# Patient Record
Sex: Male | Born: 1990 | Race: White | Hispanic: No | Marital: Single | Smoking: Never smoker
Health system: Southern US, Community
[De-identification: ages and names within clinical notes are randomized; demographics above are authoritative.]

---

## 2015-04-25 ENCOUNTER — Emergency Department: Payer: Commercial Indemnity

## 2015-04-25 ENCOUNTER — Emergency Department
Admission: EM | Admit: 2015-04-25 | Discharge: 2015-04-26 | Disposition: A | Discharge status: Home / Self Care | Payer: Commercial Indemnity | Attending: Emergency Medicine | Admitting: Emergency Medicine

## 2015-04-25 ENCOUNTER — Encounter: Payer: Self-pay | Admitting: Emergency Medicine

## 2015-04-25 DIAGNOSIS — S67194A Crushing injury of right ring finger, initial encounter: Secondary | ICD-10-CM | POA: Insufficient documentation

## 2015-04-25 DIAGNOSIS — S62632A Displaced fracture of distal phalanx of right middle finger, initial encounter for closed fracture: Secondary | ICD-10-CM | POA: Insufficient documentation

## 2015-04-25 DIAGNOSIS — Y9289 Other specified places as the place of occurrence of the external cause: Secondary | ICD-10-CM | POA: Diagnosis not present

## 2015-04-25 DIAGNOSIS — Y998 Other external cause status: Secondary | ICD-10-CM | POA: Insufficient documentation

## 2015-04-25 DIAGNOSIS — S60041A Contusion of right ring finger without damage to nail, initial encounter: Secondary | ICD-10-CM | POA: Diagnosis not present

## 2015-04-25 DIAGNOSIS — W231XXA Caught, crushed, jammed, or pinched between stationary objects, initial encounter: Secondary | ICD-10-CM | POA: Insufficient documentation

## 2015-04-25 DIAGNOSIS — S62634A Displaced fracture of distal phalanx of right ring finger, initial encounter for closed fracture: Secondary | ICD-10-CM | POA: Diagnosis not present

## 2015-04-25 DIAGNOSIS — S60031A Contusion of right middle finger without damage to nail, initial encounter: Secondary | ICD-10-CM | POA: Insufficient documentation

## 2015-04-25 DIAGNOSIS — S67192A Crushing injury of right middle finger, initial encounter: Secondary | ICD-10-CM | POA: Insufficient documentation

## 2015-04-25 DIAGNOSIS — S6720XA Crushing injury of unspecified hand, initial encounter: Secondary | ICD-10-CM

## 2015-04-25 DIAGNOSIS — Y9389 Activity, other specified: Secondary | ICD-10-CM | POA: Diagnosis not present

## 2015-04-25 MED ORDER — IBUPROFEN 800 MG PO TABS
ORAL_TABLET | ORAL | Status: AC
  Administered 2015-04-25: 800 mg via ORAL
  Filled 2015-04-25: qty 1

## 2015-04-25 MED ORDER — IBUPROFEN 800 MG PO TABS
800.0000 mg | ORAL_TABLET | Freq: Once | ORAL | Status: AC
  Administered 2015-04-25: 800 mg via ORAL

## 2015-04-25 MED ORDER — IBUPROFEN 800 MG PO TABS: 800.0000 mg | ORAL_TABLET | Freq: Once | ORAL | Status: DC

## 2015-04-25 NOTE — ED Notes (Signed)
Was helping friends lay a counter top and his fingers got crushed under - he pulled them out and has a partially avulsed fingernail and both are bruised.

## 2015-04-25 NOTE — ED Provider Notes (Signed)
[facility] Emergency Department Provider Note  ____________________________________________  Time seen: Approximately 11:46 PM  I have reviewed the triage vital signs and the nursing notes.   HISTORY  Chief Complaint Finger Injury    HPI Joseph Holmes is a 24 y.o. male who presents to the ED from home s/p finger injury. Patient was helping friends lay a countertop and his right third and fourth fingertips got crushed underneath. Patient complains of 5/10 throbbing type pain to injured fingers. Tetanus shot is up-to-date. Patient is right-hand dominant. Denies other injuries and voices no other complaints. Specifically denies numbness/tingling.   History reviewed. No pertinent past medical history.  Patient denies history of diabetes  There are no active problems to display for this patient.   History reviewed. No pertinent past surgical history.  No current outpatient prescriptions on file.  Allergies Review of patient's allergies indicates no known allergies.  History reviewed. No pertinent family history.  Social History History  Substance Use Topics  . Smoking status: Never Smoker   . Smokeless tobacco: Not on file  . Alcohol Use: 0.6 - 1.2 oz/week    1-2 Cans of beer per week     Comment: beer daily    Review of Systems Constitutional: No fever/chills Eyes: No visual changes. ENT: No sore throat. Cardiovascular: Denies chest pain. Respiratory: Denies shortness of breath. Gastrointestinal: No abdominal pain.  No nausea, no vomiting.  No diarrhea.  No constipation. Genitourinary: Negative for dysuria. Musculoskeletal: Positive for injury and pain to right third and fourth fingers. Negative for back pain. Skin: Negative for rash. Neurological: Negative for headaches, focal weakness or numbness.  10-point ROS otherwise negative.  ____________________________________________   PHYSICAL EXAM:  VITAL SIGNS: ED Triage Vitals  Enc  Vitals Group     BP 04/25/15 2134 141/87 mmHg     Pulse Rate 04/25/15 2134 95     Resp 04/25/15 2345 18     Temp 04/25/15 2134 97.9 F (36.6 C)     Temp Source 04/25/15 2134 Oral     SpO2 04/25/15 2134 97 %     Weight --      Height --      Head Cir --      Peak Flow --      Pain Score 04/25/15 2117 3     Pain Loc --      Pain Edu? --      Excl. in GC? --     Constitutional: Alert and oriented. Well appearing and in no acute distress. Eyes: Conjunctivae are normal. PERRL. EOMI. Head: Atraumatic. Nose: No congestion/rhinnorhea. Mouth/Throat: Mucous membranes are moist.  Oropharynx non-erythematous. Neck: No stridor.   Cardiovascular: Normal rate, regular rhythm. Grossly normal heart sounds.  Good peripheral circulation. Respiratory: Normal respiratory effort.  No retractions. Lungs CTAB. Gastrointestinal: Soft and nontender. No distention. No abdominal bruits. No CVA tenderness. Musculoskeletal: Right hand has 2+ radial pulses. Grip strength is excellent and range of motion is intact. Right third fingertip: Avulsion injury with macerated tissue medial and inferior to nail without active bleeding. There is no nailbed laceration. There is a subungual hematoma. Patient has brisk capillary refill and digital sensation is intact. Right fourth fingertip: Small subungual hematoma. There is no avulsion or laceration. Patient has brisk capillary refill and digital sensation is intact. Neurologic:  Normal speech and language. No gross focal neurologic deficits are appreciated. Speech is normal. No gait instability. Skin:  Skin is warm, dry and intact. No rash noted. Psychiatric:  Mood and affect are normal. Speech and behavior are normal.  ____________________________________________   LABS (all labs ordered are listed, but only abnormal results are displayed)  Labs Reviewed - No data to  display ____________________________________________  EKG  None ____________________________________________  RADIOLOGY  Right hand complete (viewed by me, interpreted by Dr. Andria MeuseStevens): Comminuted fractures of the distal phalangeal tufts of the right third and fourth fingers. ____________________________________________   PROCEDURES  Procedure(s) performed: None  Critical Care performed: No  ____________________________________________   INITIAL IMPRESSION / ASSESSMENT AND PLAN / ED COURSE  Pertinent labs & imaging results that were available during my care of the patient were reviewed by me and considered in my medical decision making (see chart for details).  24 year old male s/p crush injuries to right third and fourth fingers with tufts fractures. No sutures required. Third finger was soaked in Betadine/normal saline solution and dressed in Xeroform and gauze. Extensive discussion with patient and his mother. We will treat crush injury with wound care. Did discuss that patient will most likely lose the nails on his third and fourth fingertips. Strict return precautions given. Both verbalize understanding and agree with plan of care. ____________________________________________   FINAL CLINICAL IMPRESSION(S) / ED DIAGNOSES  Final diagnoses:  Crushing injury of hand and fingers, initial encounter      Irean HongJade J Kjuan Seipp, MD 04/26/15 [phone]

## 2015-04-26 MED ORDER — IBUPROFEN 800 MG PO TABS: 800.0000 mg | ORAL_TABLET | Freq: Three times a day (TID) | ORAL | Status: AC | PRN

## 2015-04-26 MED ORDER — HYDROCODONE-ACETAMINOPHEN 5-325 MG PO TABS: 1.0000 | ORAL_TABLET | Freq: Four times a day (QID) | ORAL | Status: AC | PRN

## 2015-04-26 NOTE — Discharge Instructions (Signed)
1. Take pain medicines as needed (Motrin/Norco #15). 2. You may remove bandage in 2 days. After that please keep the wound clean and dry. 3. Return to the ER for worsening symptoms, increased redness or swelling, purulent drainage or other concerns.  Crush Injury, Fingers or Toes A crush injury to the fingers or toes means the tissues have been damaged by being squeezed (compressed). There will be bleeding into the tissues and swelling. Often, blood will collect under the skin. When this happens, the skin on the finger often dies and may slough off (shed) 1 week to 10 days later. Usually, new skin is growing underneath. If the injury has been too severe and the tissue does not survive, the damaged tissue may begin to turn black over several days.  Wounds which occur because of the crushing may be stitched (sutured) shut. However, crush injuries are more likely to become infected than other injuries.These wounds may not be closed as tightly as other types of cuts to prevent infection. Nails involved are often lost. These usually grow back over several weeks.  DIAGNOSIS X-rays may be taken to see if there is any injury to the bones. TREATMENT Broken bones (fractures) may be treated with splinting, depending on the fracture. Often, no treatment is required for fractures of the last bone in the fingers or toes. HOME CARE INSTRUCTIONS   The crushed part should be raised (elevated) above the heart or center of the chest as much as possible for the first several days or as directed. This helps with pain and lessens swelling. Less swelling increases the chances that the crushed part will survive.  Put ice on the injured area.  Put ice in a plastic bag.  Place a towel between your skin and the bag.  Leave the ice on for 15-20 minutes, 03-04 times a day for the first 2 days.  Only take over-the-counter or prescription medicines for pain, discomfort, or fever as directed by your caregiver.  Use your  injured part only as directed.  Change your bandages (dressings) as directed.  Keep all follow-up appointments as directed by your caregiver. Not keeping your appointment could result in a chronic or permanent injury, pain, and disability. If there is any problem keeping the appointment, you must call to reschedule. SEEK IMMEDIATE MEDICAL CARE IF:   There is redness, swelling, or increasing pain in the wound area.  Pus is coming from the wound.  You have a fever.  You notice a bad smell coming from the wound or dressing.  The edges of the wound do not stay together after the sutures have been removed.  You are unable to move the injured finger or toe. MAKE SURE YOU:   Understand these instructions.  Will watch your condition.  Will get help right away if you are not doing well or get worse. Document Released: 10/02/2005 Document Revised: 12/25/2011 Document Reviewed: 02/17/2011 [facility] Patient Information 2015 [place], [place]. This information is not intended to replace advice given to you by your health care provider. Make sure you discuss any questions you have with your health care provider.  Fingertip Injuries and Amputations Fingertip injuries are common and often get injured because they are last to escape when pulling your hand out of harm's way. You have amputated (cut off) part of your finger. How this turns out depends largely on how much was amputated. If just the tip is amputated, often the end of the finger will grow back and the finger may return  to much the same as it was before the injury.  If more of the finger is missing, your caregiver has done the best with the tissue remaining to allow you to keep as much finger as is possible. Your caregiver after checking your injury has tried to leave you with a painless fingertip that has durable, feeling skin. If possible, your caregiver has tried to maintain the finger's length and appearance and preserve its fingernail.    Please read the instructions outlined below and refer to this sheet in the next few weeks. These instructions provide you with general information on caring for yourself. Your caregiver may also give you specific instructions. While your treatment has been done according to the most current medical practices available, unavoidable complications occasionally occur. If you have any problems or questions after discharge, please call your caregiver. HOME CARE INSTRUCTIONS   You may resume normal diet and activities as directed or allowed.  Keep your hand elevated above the level of your heart. This helps decrease pain and swelling.  Keep ice packs (or a bag of ice wrapped in a towel) on the injured area for 15-20 minutes, 03-04 times per day, for the first two days.  Change dressings if necessary or as directed.  Clean the wound daily or as directed.  Only take over-the-counter or prescription medicines for pain, discomfort, or fever as directed by your caregiver.  Keep appointments as directed. SEEK IMMEDIATE MEDICAL CARE IF:  You develop redness, swelling, numbness or increasing pain in the wound.  There is pus coming from the wound.  You develop an unexplained oral temperature above 102 F (38.9 C) or as your caregiver suggests.  There is a foul (bad) smell coming from the wound or dressing.  There is a breaking open of the wound (edges not staying together) after sutures or staples have been removed. MAKE SURE YOU:   Understand these instructions.  Will watch your condition.  Will get help right away if you are not doing well or get worse. Document Released: 08/23/2005 Document Revised: 12/25/2011 Document Reviewed: 07/22/2008 [facility] Patient Information 2015 [place]. This information is not intended to replace advice given to you by your health care provider. Make sure you discuss any questions you have with your health care provider.

## 2016-10-30 IMAGING — CR DG HAND COMPLETE 3+V*R*
1 series · 3 of 3 positions shown · non-contrast
Comparison: None.

CLINICAL DATA: Crush injury to the third and fourth digits after
concrete counter top fell on the hands today.

EXAM:
RIGHT HAND - COMPLETE 3+ VIEW

[Series 1: dg hand complete right · 0.14mm/px · 3 of 3 slices shown]
[im 1/3]
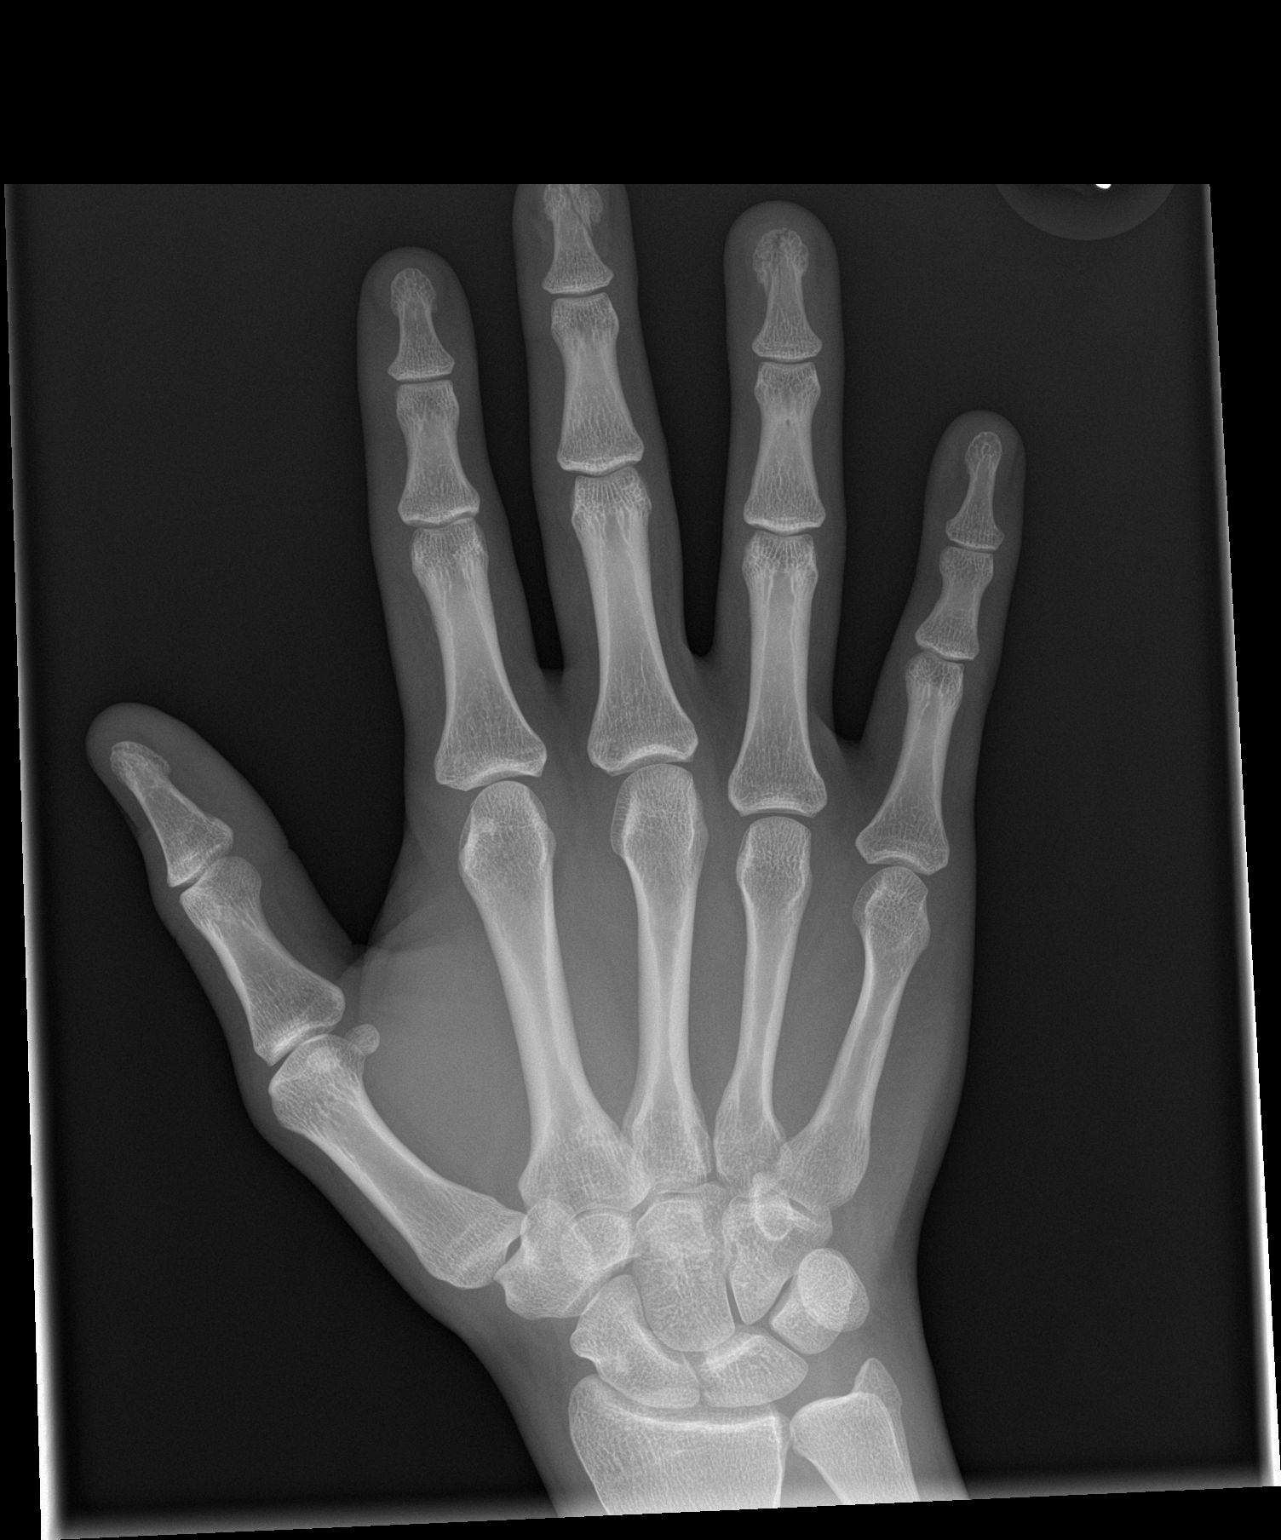
[im 2/3]
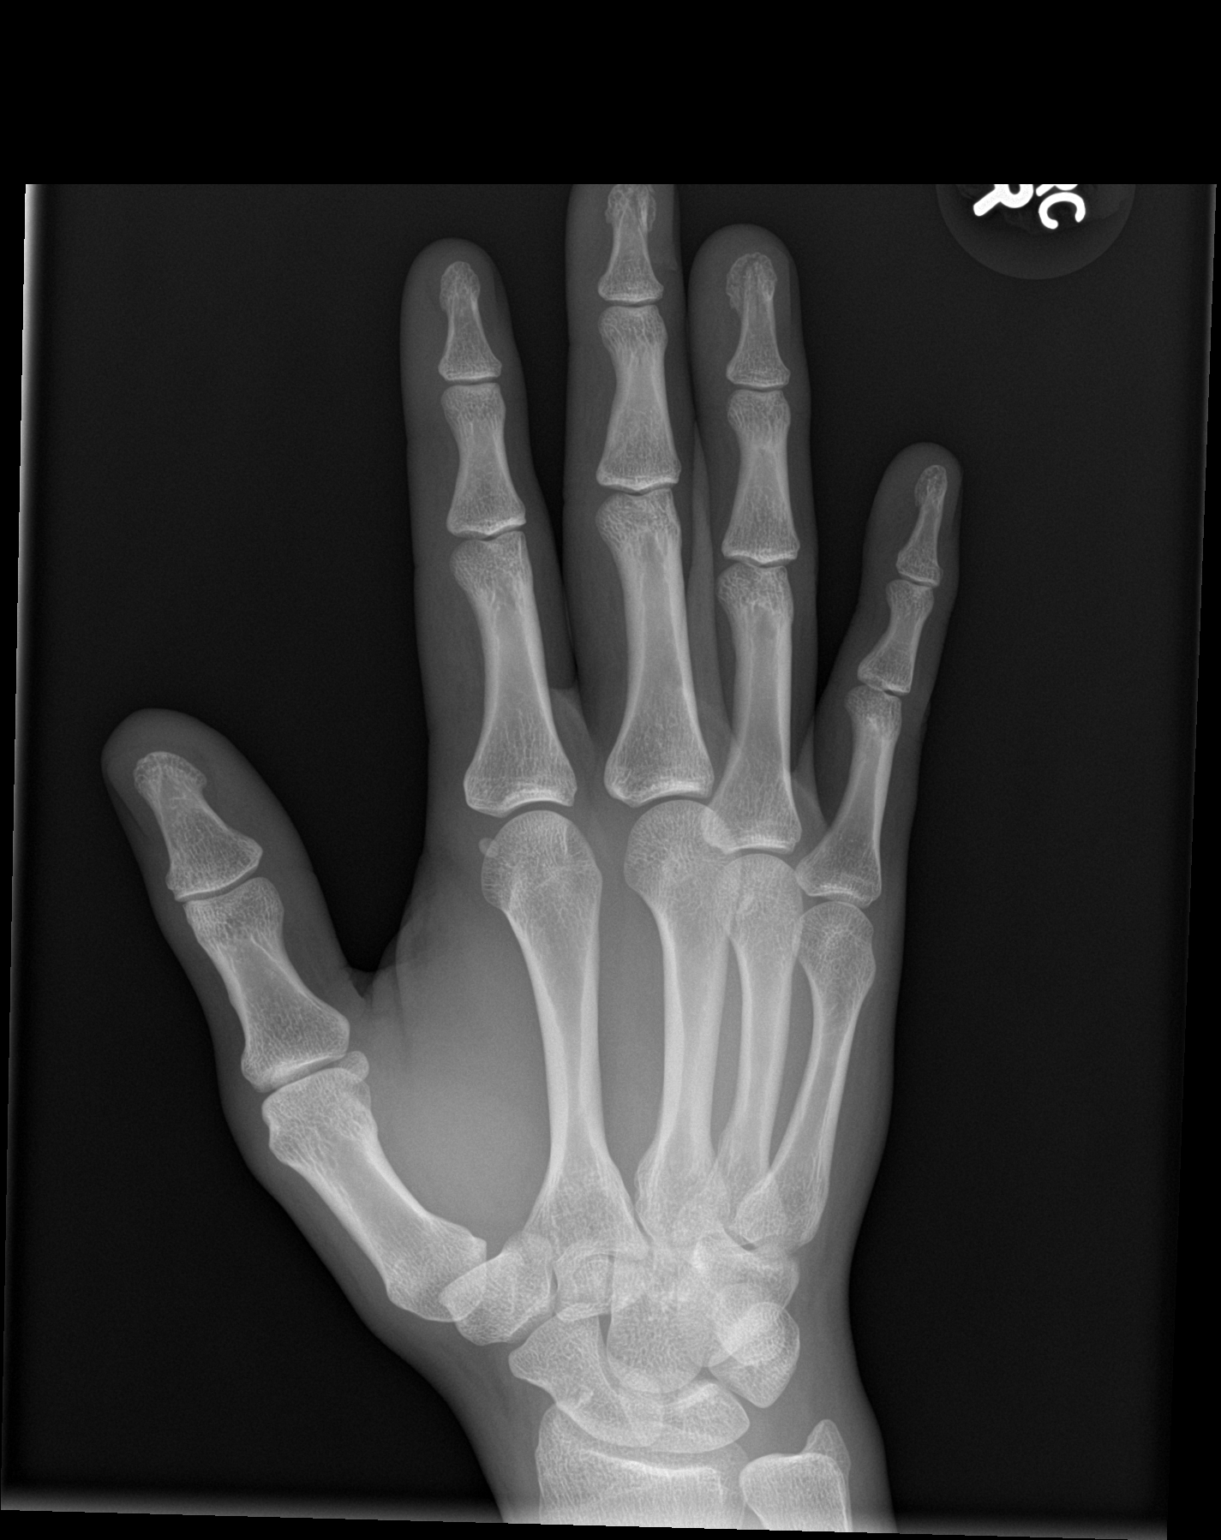
[im 3/3]
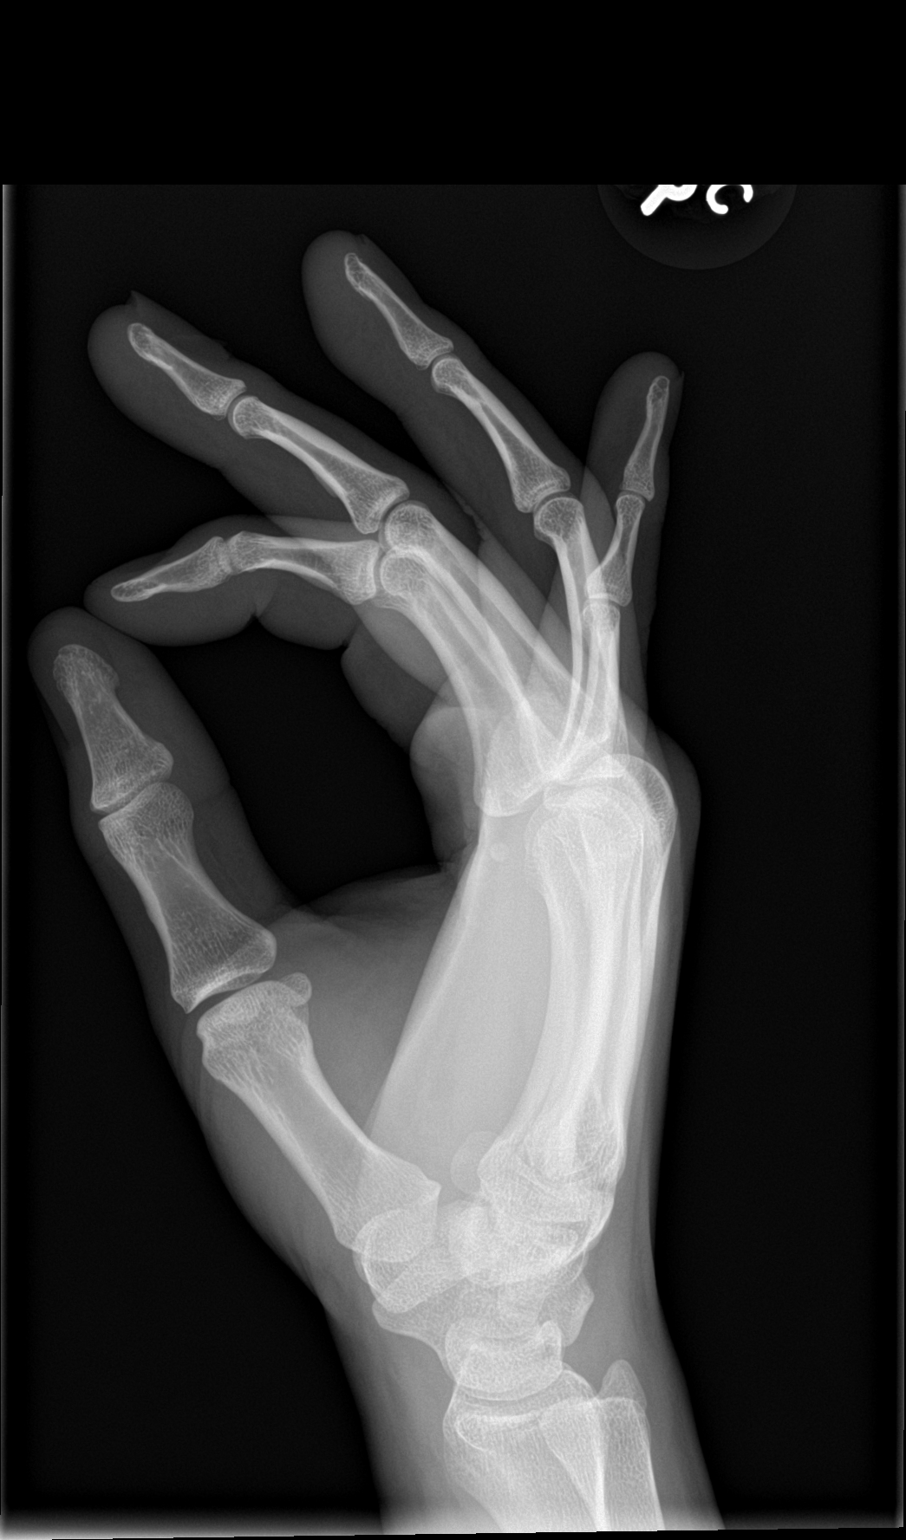

[3 of 3 positions shown; findings below may reference images not displayed]

FINDINGS: Comminuted crush type fractures of the distal phalanges of the right
third and fourth fingers. Mild associated soft tissue swelling. No
intra-articular involvement. No radiopaque soft tissue foreign
bodies.
IMPRESSION: Comminuted fractures of the distal phalangeal [REDACTED] of the right
third and fourth fingers.
